# Patient Record
Sex: Male | Born: 2000 | Race: White | Hispanic: No | Marital: Single | Smoking: Never smoker
Health system: Southern US, Community
[De-identification: ages and names within clinical notes are randomized; demographics above are authoritative.]

---
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
## 2018-03-15 ENCOUNTER — Encounter

## 2018-03-15 ENCOUNTER — Encounter: Payer: Self-pay | Admitting: Urology

## 2018-03-15 ENCOUNTER — Ambulatory Visit (INDEPENDENT_AMBULATORY_CARE_PROVIDER_SITE_OTHER): Payer: BLUE CROSS/BLUE SHIELD | Admitting: Urology

## 2018-03-15 VITALS — BP 147/88 | HR 61 | Ht 68.0 in | Wt 175.1 lb

## 2018-03-15 DIAGNOSIS — N5082 Scrotal pain: Secondary | ICD-10-CM

## 2018-03-15 DIAGNOSIS — R93429 Abnormal radiologic findings on diagnostic imaging of unspecified kidney: Secondary | ICD-10-CM

## 2018-03-15 MED ORDER — AMITRIPTYLINE HCL 25 MG PO TABS: 25.0000 mg | ORAL_TABLET | Freq: Every day | ORAL | 0 refills | Status: DC

## 2018-03-15 NOTE — Progress Notes (Signed)
03/15/2018 9:28 AM   Darol DestineMason W Straub 02/17/2001 [id number]  Referring provider: No referring provider defined for this encounter.  Chief Complaint  Patient presents with  . Testicle Pain    HPI: [age] year old male presents with a 4 month history of left scrotal content pain.  He was initially seen at a [facility] facility on 12/15/2017.  He had intercourse in [place] and did not remember if it was protected.  He had negative testing for STI and was treated empirically with Rocephin and doxycycline.  His symptoms resolved for 5 days then returned.  At that visit he was felt to have right epididymitis and was treated with doxycycline.  He was subsequently referred to [facility] and has been seeing a NP there.  He was initially seen on 01/20/2018 and was complaining of bilateral scrotal pain, left > right.  Urinalysis and urine culture was negative.  PVR was 0 mL.  He was started on tamsulosin empirically.  He had a follow-up appointment 11/5 and had continued with intermittent pain.  A scrotal ultrasound was performed on 11/5 which showed normal-appearing testes bilaterally.  He was found to have a left varicocele.  A renal ultrasound was subsequently performed on 11/27 which showed mild left pelviectasis versus an extrarenal pelvis.  He is scheduled for a CT on 12/23 and has a follow-up with [facility].  He presents today with his father who is trying to expedite his evaluation as he is scheduled to enlist in the [employer] after the first of the year.  He presently complains of only left scrotal pain.  He states his pain is constant and described as dull and aching.  Severity is rated 6/10.  He denies frequency, urgency, dysuria or gross hematuria.  Denies previous history of scrotal pain or prior urologic evaluation.   PMH: No past medical history on file.  Surgical History: No past surgical history on file.  Home Medications:  Allergies as of 03/15/2018   No Known  Allergies     Medication List        Accurate as of 03/15/18  9:28 AM. Always use your most recent med list.          EPINEPHrine 0.3 mg/0.3 mL Soaj injection Commonly known as:  EPI-PEN Inject into the muscle.       Allergies: No Known Allergies  Family History: No family history on file.  Social History:  reports that he has never smoked. He has never used smokeless tobacco. He reports that he drinks alcohol. He reports that he has current or past drug history.  ROS: UROLOGY Frequent Urination?: No Hard to postpone urination?: No Burning/pain with urination?: No Get up at night to urinate?: No Leakage of urine?: No Urine stream starts and stops?: No Trouble starting stream?: No Do you have to strain to urinate?: No Blood in urine?: No Urinary tract infection?: No Sexually transmitted disease?: No Injury to kidneys or bladder?: No Painful intercourse?: No Weak stream?: No Erection problems?: No Penile pain?: No  Gastrointestinal Nausea?: No Vomiting?: No Indigestion/heartburn?: No Diarrhea?: No Constipation?: No  Constitutional Fever: No Night sweats?: No Weight loss?: No Fatigue?: No  Skin Skin rash/lesions?: No Itching?: No  Eyes Blurred vision?: No Double vision?: No  Ears/Nose/Throat Sore throat?: No Sinus problems?: No  Hematologic/Lymphatic Swollen glands?: No Easy bruising?: No  Cardiovascular Leg swelling?: No Chest pain?: No  Respiratory Cough?: No Shortness of breath?: No  Endocrine Excessive thirst?: No  Musculoskeletal Back pain?: No Joint pain?: No  Neurological Headaches?: No Dizziness?: No  Psychologic Depression?: No Anxiety?: No  Physical Exam: BP (!) 147/88 (BP Location: Left Arm, Patient Position: Sitting, Cuff Size: Normal)   Pulse 61   Ht 5\' 8"  (1.727 m)   Wt 175 lb 1.6 oz (79.4 kg)   BMI 26.62 kg/m   Constitutional:  Alert and oriented, No acute distress. HEENT: [place] AT, moist mucus membranes.   Trachea midline, no masses. Cardiovascular: No clubbing, cyanosis, or edema. Respiratory: Normal respiratory effort, no increased work of breathing. GI: Abdomen is soft, nontender, nondistended, no abdominal masses GU: No CVA tenderness.  Phallus circumcised without lesions.  Meatus normal.  Testes descended bilaterally without masses or tenderness.  I do not appreciate a left varicocele.  Epididymes palpably normal bilaterally without enlargement, mass or tenderness. Lymph: No cervical or inguinal lymphadenopathy. Skin: No rashes, bruises or suspicious lesions. Neurologic: Grossly intact, no focal deficits, moving all 4 extremities. Psychiatric: Normal mood and affect.   Pertinent Imaging: Scrotal and renal ultrasound images were not available for review.  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  I do not appreciate a varicocele on exam and a subclinical ultrasound.  His pain is not typical for varicocele related pain and a subclinical varicocele should not be an etiology of this pain.  His renal ultrasound showed mild left pelviectasis.  Agree with a CT of the abdomen pelvis to evaluate for referred pain such as a ureteral calculus.  Trial amitriptyline 25 mg nightly and follow-up in 2 to 3 weeks.  If CT negative and persistent pain on follow-up would recommend a diagnostic cord block.   Riki Altes, MD  [facility] [address], [address] [place], [place] [address] [phone]

## 2018-03-23 ENCOUNTER — Ambulatory Visit
Admission: RE | Admit: 2018-03-23 | Discharge: 2018-03-23 | Disposition: A | Discharge status: Home / Self Care | Payer: BLUE CROSS/BLUE SHIELD | Source: Ambulatory Visit | Attending: Urology | Admitting: Urology

## 2018-03-23 DIAGNOSIS — R93429 Abnormal radiologic findings on diagnostic imaging of unspecified kidney: Secondary | ICD-10-CM | POA: Diagnosis present

## 2018-03-27 ENCOUNTER — Encounter: Payer: Self-pay | Admitting: Urology

## 2018-03-27 ENCOUNTER — Ambulatory Visit (INDEPENDENT_AMBULATORY_CARE_PROVIDER_SITE_OTHER): Payer: BLUE CROSS/BLUE SHIELD | Admitting: Urology

## 2018-03-27 DIAGNOSIS — N1330 Unspecified hydronephrosis: Secondary | ICD-10-CM

## 2018-03-27 NOTE — Progress Notes (Signed)
03/27/2018 12:20 PM   Manuel Mitchell 10/24/2000 [id number]  Referring provider: No referring provider defined for this encounter.  Chief Complaint  Patient presents with  . Follow-up  . CT results    HPI: 17 year old male initially seen on 03/15/2018 for chronic left scrotal content pain.  Refer to my prior note of that date for details.  He has seen no significant improvement in his pain with amitriptyline.  Pain is stable and has not worsened.  Noncontrast CT abdomen pelvis performed 03/23/2018 shows proximal left hydroureter and mild hydronephrosis.  No calculus is identified.   PMH: History reviewed. No pertinent past medical history.  Surgical History: History reviewed. No pertinent surgical history.  Home Medications:  Allergies as of 03/27/2018   No Known Allergies     Medication List       Accurate as of March 27, 2018 12:20 PM. Always use your most recent med list.        amitriptyline 25 MG tablet Commonly known as:  ELAVIL Take 1 tablet (25 mg total) by mouth at bedtime.   EPINEPHrine 0.3 mg/0.3 mL Soaj injection Commonly known as:  EPI-PEN Inject into the muscle.       Allergies: No Known Allergies  Family History: History reviewed. No pertinent family history.  Social History:  reports that he has never smoked. He has never used smokeless tobacco. He reports current alcohol use. He reports previous drug use.  ROS: UROLOGY Frequent Urination?: No Hard to postpone urination?: No Burning/pain with urination?: No Get up at night to urinate?: No Leakage of urine?: No Urine stream starts and stops?: No Trouble starting stream?: No Do you have to strain to urinate?: No Blood in urine?: No Urinary tract infection?: No Sexually transmitted disease?: No Injury to kidneys or bladder?: No Painful intercourse?: No Weak stream?: No Erection problems?: No Penile pain?: No  Gastrointestinal Nausea?: No Vomiting?:  No Indigestion/heartburn?: No Diarrhea?: No Constipation?: No  Constitutional Fever: No Night sweats?: No Weight loss?: No Fatigue?: No  Skin Skin rash/lesions?: No Itching?: No  Eyes Blurred vision?: No Double vision?: No  Ears/Nose/Throat Sore throat?: No Sinus problems?: No  Hematologic/Lymphatic Swollen glands?: No Easy bruising?: No  Cardiovascular Leg swelling?: No Chest pain?: No  Respiratory Cough?: No Shortness of breath?: No  Endocrine Excessive thirst?: No  Musculoskeletal Back pain?: No Joint pain?: No  Neurological Headaches?: No Dizziness?: No  Psychologic Depression?: No Anxiety?: No  Physical Exam: BP 137/88 (BP Location: Left Arm, Patient Position: Sitting, Cuff Size: Normal)   Pulse 78   Ht 5\' 8"  (1.727 m)   Wt 176 lb 9.6 oz (80.1 kg)   BMI 26.85 kg/m   Constitutional:  Alert and oriented, No acute distress. HEENT: [place] AT, moist mucus membranes.  Trachea midline, no masses. Cardiovascular: No clubbing, cyanosis, or edema. Respiratory: Normal respiratory effort, no increased work of breathing. Skin: No rashes, bruises or suspicious lesions. Neurologic: Grossly intact, no focal deficits, moving all 4 extremities. Psychiatric: Normal mood and affect.   Pertinent Imaging: Images were personally reviewed  Results for orders placed during the hospital encounter of 03/23/18  CT RENAL STONE STUDY   Narrative CLINICAL DATA:  Follow-up LEFT-sided testicular pain.  EXAM: CT ABDOMEN AND PELVIS WITHOUT CONTRAST  TECHNIQUE: Multidetector CT imaging of the abdomen and pelvis was performed following the standard protocol without IV contrast.  COMPARISON:  None.  FINDINGS: Lower chest: Lung bases are clear.  Hepatobiliary: No focal hepatic lesion. No biliary duct dilatation.  Gallbladder is normal. Common bile duct is normal.  Pancreas: Pancreas is normal. No ductal dilatation. No pancreatic inflammation.  Spleen: Normal  spleen  Adrenals/urinary tract: Adrenal glands normal. No nephrolithiasis. Proximal LEFT hydroureter mild LEFT pelvicaliectasis. The LEFT ureter transits to normal caliber in the mid ureter. The ureter is difficult to follow in this young patient. No ureterolithiasis. The distal ureter is normal caliber. No bladder calculi.  RIGHT kidney is normal.  Stomach/Bowel: Stomach, small bowel, appendix, and cecum are normal. The colon and rectosigmoid colon are normal.  Vascular/Lymphatic: Abdominal aorta is normal caliber. No periportal or retroperitoneal adenopathy. No pelvic adenopathy.  Reproductive: Testicles grossly normal. Scrotum grossly normal prostate normal.  Other: No free fluid.  Musculoskeletal: No aggressive osseous lesion.  IMPRESSION: 1. Mild proximal LEFT hydroureter and LEFT pelvicaliectasis. No obstructing lesion identified on this noncontrast exam. No nephrolithiasis. Distal LEFT ureter appears normal caliber. 2. Normal RIGHT kidney ureter 3. Scrotum and testicles grossly normal by CT.   Electronically Signed   By: Genevive BiStewart  Edmunds M.D.   On: 03/23/2018 15:28     Assessment & Plan:   17 year old male with chronic left scrotal content pain and CT showing proximal hydroureter and mild hydronephrosis.  A ureteral calculus is not identified.  Discussed with patient and his father the etiology of the CT findings is not clear and the possibility he could be having referred left hemiscrotal pain due to obstruction.  I recommended a Lasix renogram and if any evidence of obstruction cystoscopy with left retrograde pyelogram and ureteroscopy.  He will be notified with the nuclear imaging results and further recommendations.   Riki AltesScott C Zachery Niswander, MD  [facility] [address], [address] [place], [place] [address] [phone]

## 2018-03-31 ENCOUNTER — Encounter
Admission: RE | Admit: 2018-03-31 | Discharge: 2018-03-31 | Disposition: A | Discharge status: Home / Self Care | Payer: BLUE CROSS/BLUE SHIELD | Source: Ambulatory Visit | Attending: Urology | Admitting: Urology

## 2018-03-31 DIAGNOSIS — N1330 Unspecified hydronephrosis: Secondary | ICD-10-CM | POA: Insufficient documentation

## 2018-03-31 MED ORDER — TECHNETIUM TC 99M MERTIATIDE
5.5560 | Freq: Once | INTRAVENOUS | Status: AC | PRN
  Administered 2018-03-31: 5.556 via INTRAVENOUS

## 2018-03-31 MED ORDER — FUROSEMIDE 10 MG/ML IJ SOLN
40.0000 mg | Freq: Once | INTRAMUSCULAR | Status: AC
  Administered 2018-03-31: 40 mg via INTRAVENOUS
  Filled 2018-03-31 (×2): qty 4

## 2018-04-07 ENCOUNTER — Telehealth: Payer: Self-pay | Admitting: Urology

## 2018-04-07 NOTE — Telephone Encounter (Signed)
Pt called office asking about nuclear imaging results and further recommendations aftter testing on 12/23.

## 2018-04-12 NOTE — Telephone Encounter (Signed)
Renal scan did not show any evidence of obstruction.  Unlikely the CT findings are responsible for scrotal pain.  If he would like to proceed with a cord block can schedule a follow-up appointment.

## 2018-04-12 NOTE — Telephone Encounter (Signed)
Left message for patient to call office to inform of results.

## 2018-04-12 NOTE — Telephone Encounter (Signed)
Pt has completed his imaging and is calling for results. Please advise pt @ [phone]. Thank you.

## 2018-04-13 NOTE — Telephone Encounter (Signed)
Informed patient of results and recommendations. 

## 2018-04-20 ENCOUNTER — Encounter: Payer: Self-pay | Admitting: Urology

## 2018-04-20 ENCOUNTER — Ambulatory Visit (INDEPENDENT_AMBULATORY_CARE_PROVIDER_SITE_OTHER): Payer: BLUE CROSS/BLUE SHIELD | Admitting: Urology

## 2018-04-20 VITALS — BP 148/93 | HR 65 | Ht 68.0 in | Wt 176.0 lb

## 2018-04-20 DIAGNOSIS — N5082 Scrotal pain: Secondary | ICD-10-CM | POA: Diagnosis not present

## 2018-04-20 NOTE — Progress Notes (Signed)
   04/20/2018 12:35 PM   Manuel Mitchell 2000-08-01 [id number]  Referring provider: No referring provider defined for this encounter.  Chief Complaint  Patient presents with  . Testicle Pain    HPI: 18 year old male with chronic left scrotal content pain.  No significant improvement on NSAIDs/amitriptyline.  Renal ultrasound showed fullness of the left collecting system and his CT showed proximal hydroureter and mild left hydronephrosis.  A Lasix renogram showed no evidence of obstruction.  Scrotal sonogram was unremarkable.  He presents today for diagnostic cord block.  PMH: No past medical history on file.  Surgical History: No past surgical history on file.  Home Medications:  Allergies as of 04/20/2018   No Known Allergies     Medication List       Accurate as of April 20, 2018 12:35 PM. Always use your most recent med list.        amitriptyline 25 MG tablet Commonly known as:  ELAVIL Take 1 tablet (25 mg total) by mouth at bedtime.   EPINEPHrine 0.3 mg/0.3 mL Soaj injection Commonly known as:  EPI-PEN Inject into the muscle.       Allergies: No Known Allergies  Family History: No family history on file.  Social History:  reports that he has never smoked. He has never used smokeless tobacco. He reports current alcohol use. He reports previous drug use.  ROS: UROLOGY Frequent Urination?: No Hard to postpone urination?: No Burning/pain with urination?: No Get up at night to urinate?: No Leakage of urine?: No Urine stream starts and stops?: No Trouble starting stream?: No Do you have to strain to urinate?: No Blood in urine?: No Urinary tract infection?: No Sexually transmitted disease?: No Injury to kidneys or bladder?: No Painful intercourse?: No Weak stream?: No Erection problems?: No Penile pain?: No  Gastrointestinal Nausea?: No Vomiting?: No Indigestion/heartburn?: No Diarrhea?: No Constipation?: No  Constitutional Fever:  No Night sweats?: No Weight loss?: No Fatigue?: No  Skin Skin rash/lesions?: No Itching?: No  Eyes Blurred vision?: No Double vision?: No  Ears/Nose/Throat Sore throat?: No Sinus problems?: No  Hematologic/Lymphatic Swollen glands?: No Easy bruising?: No  Cardiovascular Leg swelling?: No Chest pain?: No  Respiratory Cough?: No Shortness of breath?: No  Endocrine Excessive thirst?: No  Musculoskeletal Back pain?: No Joint pain?: No  Neurological Headaches?: No Dizziness?: No  Psychologic Depression?: No Anxiety?: No  Physical Exam: BP (!) 148/93 (BP Location: Left Arm, Patient Position: Sitting, Cuff Size: Normal)   Pulse 65   Ht 5\' 8"  (1.727 m)   Wt 176 lb (79.8 kg)   BMI 26.76 kg/m   Constitutional:  Alert and oriented, No acute distress. GU: Penis without lesions, testes descended bilaterally without masses or tenderness.  A left cord block was performed after prepping and draping the patient.  A milliliters of 0.5% plain bupivacaine was infiltrated into the spermatic cord at the pubic tubercle.   Assessment & Plan:   He will call back regarding any pain relief and duration with the cord block.   Riki Altes, MD  [facility] [address] [place], [place] [address] [phone]

## 2018-04-24 ENCOUNTER — Telehealth: Payer: Self-pay | Admitting: Urology

## 2018-04-24 NOTE — Telephone Encounter (Signed)
Patient called stating that the cord block did not work and he would like to know what the next step would be Please advise thanks

## 2018-04-24 NOTE — Telephone Encounter (Signed)
Pt called office asking about cord block.

## 2018-04-25 NOTE — Telephone Encounter (Signed)
Recommend referral to either [facility] or [facility] depending on his preference.

## 2018-05-01 NOTE — Telephone Encounter (Signed)
Pt asking for a referral to [facility], please advise. Thanks.

## 2020-08-23 IMAGING — CT CT RENAL STONE PROTOCOL
1 of 2 series · 15 of 32 positions shown, 19 images · non-contrast
Comparison: None.

CLINICAL DATA: Follow-up LEFT-sided testicular pain.

EXAM:
CT ABDOMEN AND PELVIS WITHOUT CONTRAST
TECHNIQUE: Multidetector CT imaging of the abdomen and pelvis was performed
following the standard protocol without IV contrast.

[Series 2: axial st · axial · 0.71mm/px · z∈[-910,-445]mm · 15 of 103 slices shown, 19 images]
[im 5/103  soft-tissue]
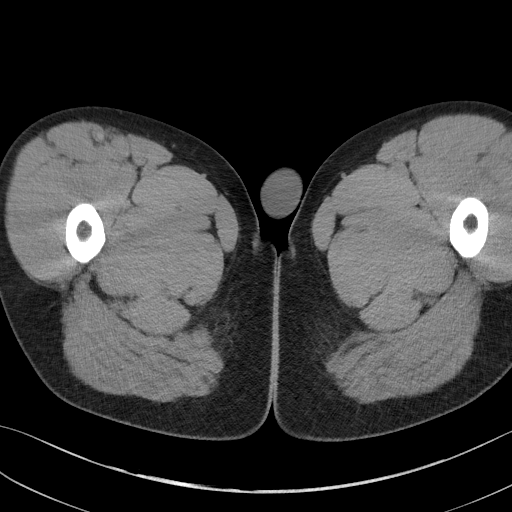
[im 5/103  bone]
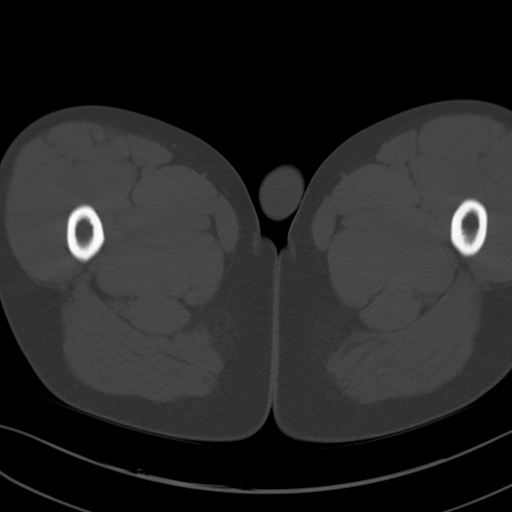
[im 13/103  soft-tissue]
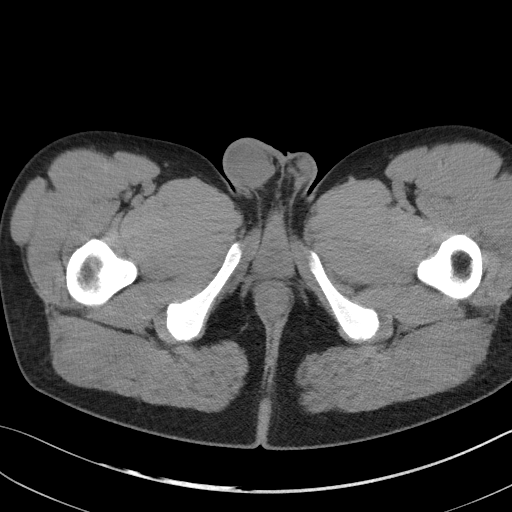
[im 22/103  soft-tissue]
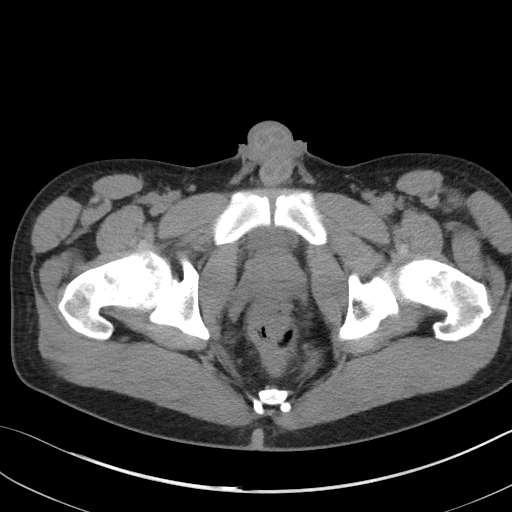
[im 30/103  soft-tissue]
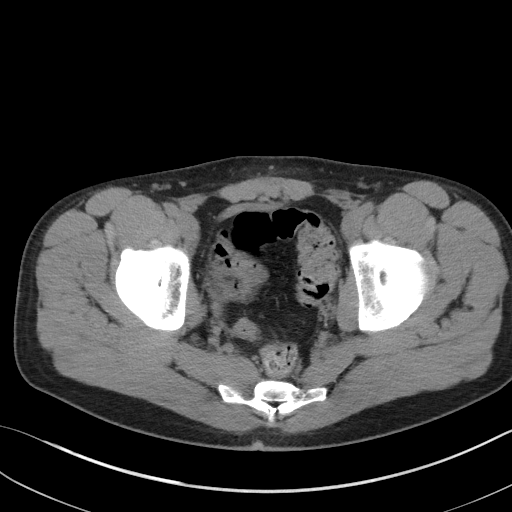
[im 35/103  soft-tissue]
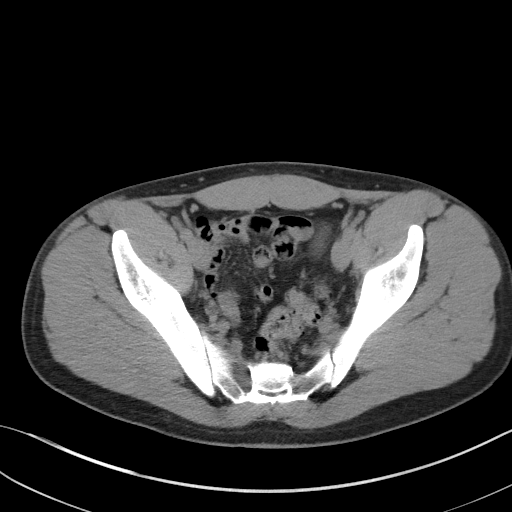
[im 43/103  soft-tissue]
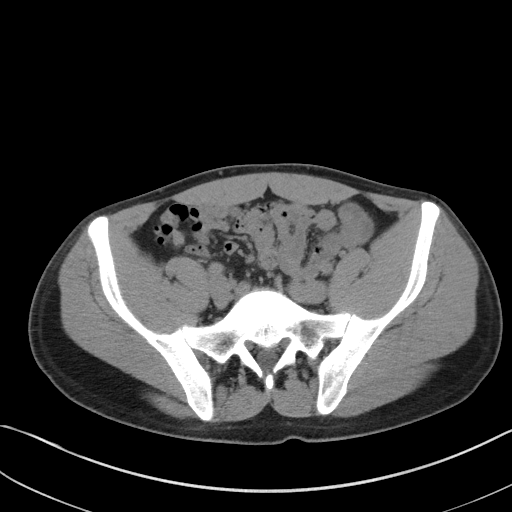
[im 52/103  soft-tissue]
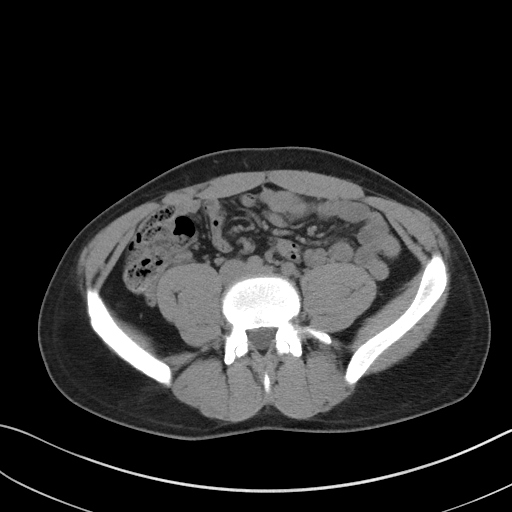
[im 60/103  soft-tissue]
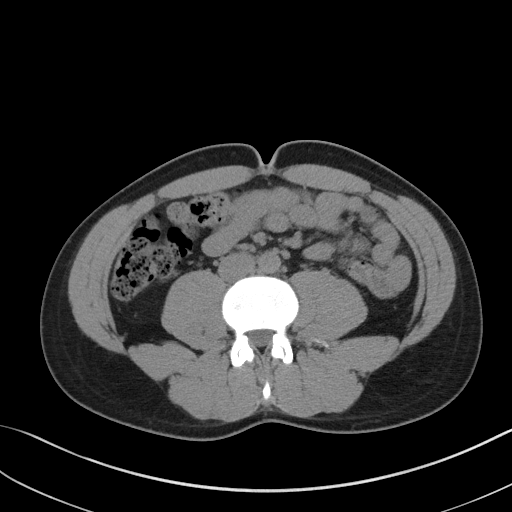
[im 69/103  soft-tissue]
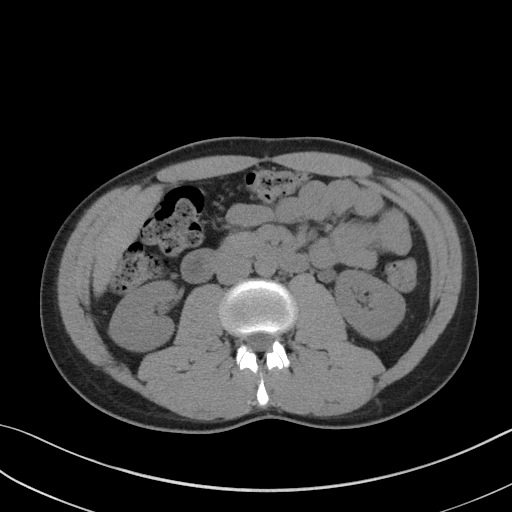
[im 69/103  bone]
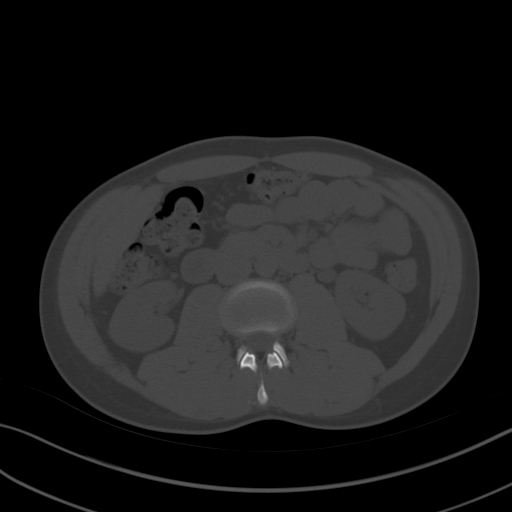
[im 73/103  soft-tissue]
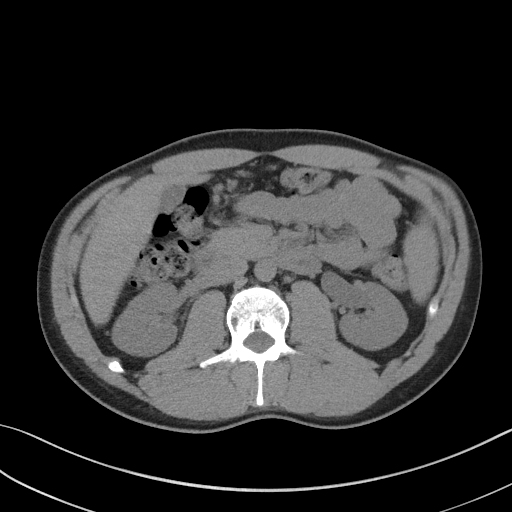
[im 81/103  soft-tissue]
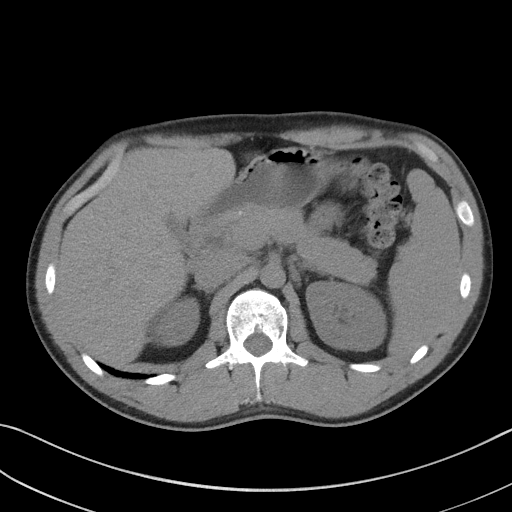
[im 86/103  lung]
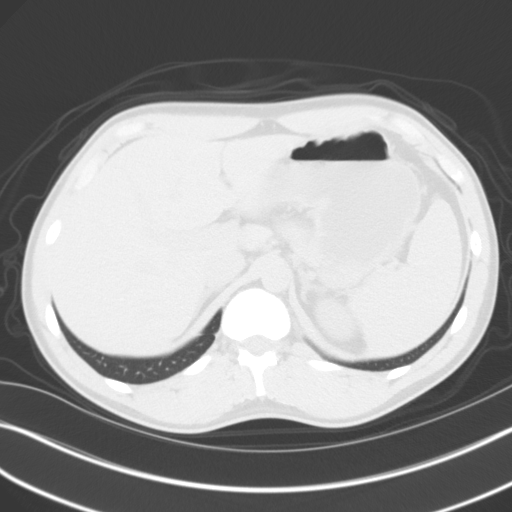
[im 90/103  soft-tissue]
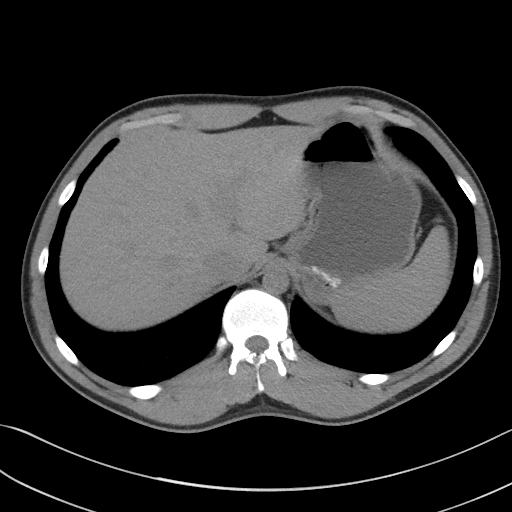
[im 90/103  lung]
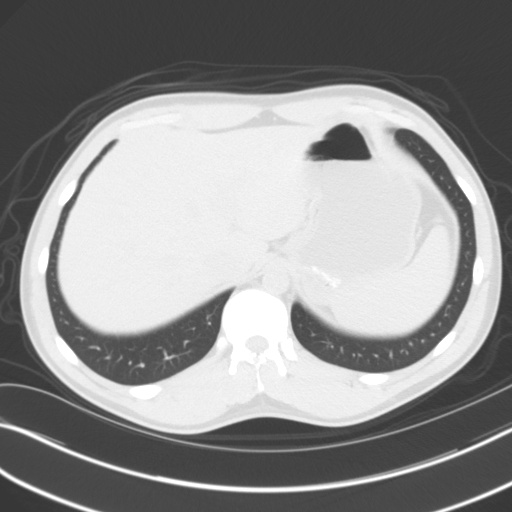
[im 94/103  lung]
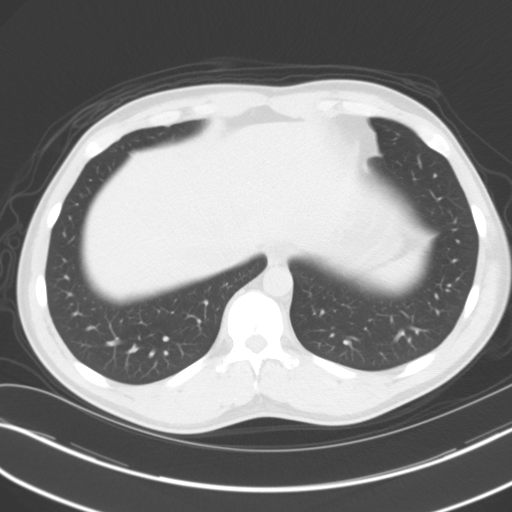
[im 98/103  soft-tissue]
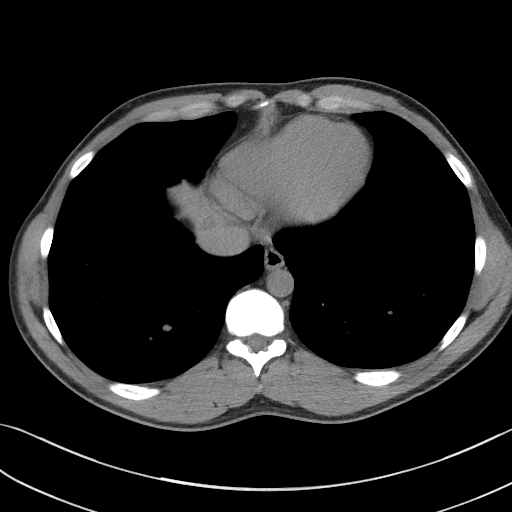
[im 98/103  lung]
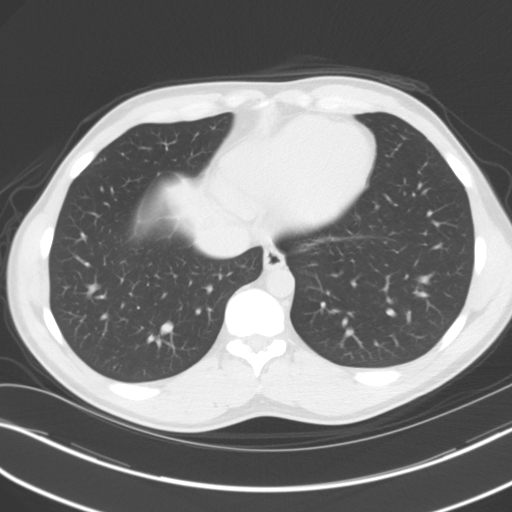

[15 of 32 positions shown; findings below may reference images not displayed]

FINDINGS: Lower chest: Lung bases are clear.

Hepatobiliary: No focal hepatic lesion. No biliary duct dilatation.
Gallbladder is normal. Common bile duct is normal.

Pancreas: Pancreas is normal. No ductal dilatation. No pancreatic
inflammation.

Spleen: Normal spleen

Adrenals/urinary tract: Adrenal glands normal. No nephrolithiasis.
Proximal LEFT hydroureter mild LEFT pelvicaliectasis. The LEFT
ureter transits to normal caliber in the mid ureter. The ureter is
difficult to follow in this young patient. No ureterolithiasis. The
distal ureter is normal caliber. No bladder calculi.

RIGHT kidney is normal.

Stomach/Bowel: Stomach, small bowel, appendix, and cecum are normal.
The colon and rectosigmoid colon are normal.

Vascular/Lymphatic: Abdominal aorta is normal caliber. No periportal
or retroperitoneal adenopathy. No pelvic adenopathy.

Reproductive: Testicles grossly normal. Scrotum grossly normal
prostate normal.

Other: No free fluid.

Musculoskeletal: No aggressive osseous lesion.
IMPRESSION: 1. Mild proximal LEFT hydroureter and LEFT pelvicaliectasis. No
obstructing lesion identified on this noncontrast exam. No
nephrolithiasis. Distal LEFT ureter appears normal caliber.
2. Normal RIGHT kidney ureter
3. Scrotum and testicles grossly normal by CT.

## 2024-04-10 ENCOUNTER — Observation Stay
Admission: EM | Admit: 2024-04-10 | Discharge: 2024-04-11 | Disposition: A | Discharge status: Home / Self Care | Attending: Family Medicine | Admitting: Family Medicine

## 2024-04-10 ENCOUNTER — Encounter: Payer: Self-pay | Admitting: Emergency Medicine

## 2024-04-10 ENCOUNTER — Ambulatory Visit
Admission: EM | Admit: 2024-04-10 | Discharge: 2024-04-10 | Attending: Emergency Medicine | Admitting: Emergency Medicine

## 2024-04-10 ENCOUNTER — Emergency Department

## 2024-04-10 ENCOUNTER — Other Ambulatory Visit: Payer: Self-pay

## 2024-04-10 DIAGNOSIS — J039 Acute tonsillitis, unspecified: Principal | ICD-10-CM | POA: Insufficient documentation

## 2024-04-10 DIAGNOSIS — J36 Peritonsillar abscess: Secondary | ICD-10-CM

## 2024-04-10 DIAGNOSIS — J029 Acute pharyngitis, unspecified: Secondary | ICD-10-CM | POA: Diagnosis present

## 2024-04-10 LAB — COMPREHENSIVE METABOLIC PANEL WITH GFR
ALT: 16 U/L (ref 0–44)
AST: 37 U/L (ref 15–41)
Albumin: 4 g/dL (ref 3.5–5.0)
Alkaline Phosphatase: 80 U/L (ref 38–126)
Anion gap: 14 (ref 5–15)
BUN: 7 mg/dL (ref 6–20)
CO2: 22 mmol/L (ref 22–32)
Calcium: 9.5 mg/dL (ref 8.9–10.3)
Chloride: 97 mmol/L — ABNORMAL LOW (ref 98–111)
Creatinine, Ser: 0.83 mg/dL (ref 0.61–1.24)
GFR, Estimated: >60 mL/min
Glucose, Bld: 101 mg/dL — ABNORMAL HIGH (ref 70–99)
Potassium: 4.8 mmol/L (ref 3.5–5.1)
Sodium: 133 mmol/L — ABNORMAL LOW (ref 135–145)
Total Bilirubin: 1 mg/dL (ref 0.0–1.2)
Total Protein: 7.9 g/dL (ref 6.5–8.1)

## 2024-04-10 LAB — CBC WITH DIFFERENTIAL/PLATELET
Abs Immature Granulocytes: 0.03 K/uL (ref 0.00–0.07)
Basophils Absolute: 0.1 K/uL (ref 0.0–0.1)
Basophils Relative: 0 %
Eosinophils Absolute: 0 K/uL (ref 0.0–0.5)
Eosinophils Relative: 0 %
HCT: 46.3 % (ref 39.0–52.0)
Hemoglobin: 16.3 g/dL (ref 13.0–17.0)
Immature Granulocytes: 0 %
Lymphocytes Relative: 14 %
Lymphs Abs: 1.7 K/uL (ref 0.7–4.0)
MCH: 30.8 pg (ref 26.0–34.0)
MCHC: 35.2 g/dL (ref 30.0–36.0)
MCV: 87.5 fL (ref 80.0–100.0)
Monocytes Absolute: 1.5 K/uL — ABNORMAL HIGH (ref 0.1–1.0)
Monocytes Relative: 12 %
Neutro Abs: 8.6 K/uL — ABNORMAL HIGH (ref 1.7–7.7)
Neutrophils Relative %: 74 %
Platelets: 250 K/uL (ref 150–400)
RBC: 5.29 MIL/uL (ref 4.22–5.81)
RDW: 11.2 % — ABNORMAL LOW (ref 11.5–15.5)
WBC: 11.9 K/uL — ABNORMAL HIGH (ref 4.0–10.5)
nRBC: 0 % (ref 0.0–0.2)

## 2024-04-10 LAB — RESP PANEL BY RT-PCR (RSV, FLU A&B, COVID)  RVPGX2
Influenza A by PCR: NEGATIVE
Influenza B by PCR: NEGATIVE
Resp Syncytial Virus by PCR: NEGATIVE
SARS Coronavirus 2 by RT PCR: NEGATIVE

## 2024-04-10 LAB — MONONUCLEOSIS SCREEN: Mono Screen: NEGATIVE

## 2024-04-10 LAB — POCT RAPID STREP A (OFFICE): Rapid Strep A Screen: NEGATIVE

## 2024-04-10 MED ORDER — ALUM & MAG HYDROXIDE-SIMETH 200-200-20 MG/5ML PO SUSP
5.0000 mL | Freq: Once | ORAL | Status: AC
  Administered 2024-04-10: 5 mL via ORAL

## 2024-04-10 MED ORDER — ACETAMINOPHEN 325 MG PO TABS: 650.0000 mg | ORAL_TABLET | Freq: Four times a day (QID) | ORAL | Status: DC | PRN

## 2024-04-10 MED ORDER — SODIUM CHLORIDE 0.9 % IV SOLN
3.0000 g | Freq: Once | INTRAVENOUS | Status: AC
  Administered 2024-04-10: 3 g via INTRAVENOUS
  Filled 2024-04-10: qty 8

## 2024-04-10 MED ORDER — DIPHENHYDRAMINE HCL 12.5 MG/5ML PO ELIX
12.5000 mg | ORAL_SOLUTION | Freq: Once | ORAL | Status: AC
  Administered 2024-04-10: 12.5 mg via ORAL

## 2024-04-10 MED ORDER — PANTOPRAZOLE SODIUM 40 MG IV SOLR
40.0000 mg | Freq: Two times a day (BID) | INTRAVENOUS | Status: DC
  Administered 2024-04-10 – 2024-04-11 (×3): 40 mg via INTRAVENOUS
  Filled 2024-04-10 (×3): qty 10

## 2024-04-10 MED ORDER — IBUPROFEN 800 MG PO TABS
800.0000 mg | ORAL_TABLET | Freq: Once | ORAL | Status: AC
  Administered 2024-04-10: 800 mg via ORAL

## 2024-04-10 MED ORDER — OXYCODONE HCL 5 MG PO TABS
5.0000 mg | ORAL_TABLET | ORAL | Status: DC | PRN
  Administered 2024-04-10 – 2024-04-11 (×2): 5 mg via ORAL
  Filled 2024-04-10 (×2): qty 1

## 2024-04-10 MED ORDER — SODIUM CHLORIDE 0.9 % IV SOLN
3.0000 g | Freq: Four times a day (QID) | INTRAVENOUS | Status: DC
  Administered 2024-04-10 – 2024-04-11 (×4): 3 g via INTRAVENOUS
  Filled 2024-04-10 (×5): qty 8

## 2024-04-10 MED ORDER — ENOXAPARIN SODIUM 40 MG/0.4ML IJ SOSY
40.0000 mg | PREFILLED_SYRINGE | Freq: Every day | INTRAMUSCULAR | Status: DC
  Administered 2024-04-10: 40 mg via SUBCUTANEOUS
  Filled 2024-04-10 (×2): qty 0.4

## 2024-04-10 MED ORDER — SODIUM CHLORIDE 0.9 % IV SOLN: INTRAVENOUS | Status: AC

## 2024-04-10 MED ORDER — ACETAMINOPHEN 650 MG RE SUPP: 650.0000 mg | Freq: Four times a day (QID) | RECTAL | Status: DC | PRN

## 2024-04-10 MED ORDER — KETOROLAC TROMETHAMINE 15 MG/ML IJ SOLN: 15.0000 mg | Freq: Four times a day (QID) | INTRAMUSCULAR | Status: DC | PRN

## 2024-04-10 MED ORDER — IOHEXOL 300 MG/ML  SOLN
75.0000 mL | Freq: Once | INTRAMUSCULAR | Status: AC | PRN
  Administered 2024-04-10: 75 mL via INTRAVENOUS

## 2024-04-10 MED ORDER — DEXAMETHASONE SOD PHOSPHATE PF 10 MG/ML IJ SOLN
10.0000 mg | Freq: Once | INTRAMUSCULAR | Status: AC
  Administered 2024-04-10: 10 mg via INTRAVENOUS

## 2024-04-10 MED ORDER — DEXAMETHASONE SOD PHOSPHATE PF 10 MG/ML IJ SOLN
8.0000 mg | Freq: Two times a day (BID) | INTRAMUSCULAR | Status: DC
  Administered 2024-04-10 – 2024-04-11 (×3): 8 mg via INTRAVENOUS
  Filled 2024-04-10 (×3): qty 0.8

## 2024-04-10 NOTE — ED Provider Notes (Signed)
 "  [facility] Provider Note    Event Date/Time   First MD Initiated Contact with Patient 04/10/24 [phone]     (approximate)   History   Sore Throat   HPI  Manuel Mitchell is a 24 y.o. male  with no significant past medical history and as listed in EMR presents to the emergency department for tree and evaluation of sore throat that started 5 days ago but has Mitchell over the past 2 days.  He was sent here from urgent care for high concern of peritonsillar abscess.  Strep test there was negative.  Patient has had intermittent low-grade fever.   Physical Exam    Vitals:   04/10/24 0916  BP: (!) 157/110  Pulse: 87  Resp: 16  Temp: 99 F (37.2 C)  SpO2: 100%    General: Awake, no distress.  CV:  Good peripheral perfusion.  Resp:  Normal effort.  Abd:  No distention.  Other:  Uvular deviation to the left with oropharynx edema and erythema. Voice is muffled. Tolerating secretions. Trismus noted--can open mouth to 3 fingerbreadths.    ED Results / Procedures / Treatments   Labs (all labs ordered are listed, but only abnormal results are displayed)  Labs Reviewed  CBC WITH DIFFERENTIAL/PLATELET - Abnormal; Notable for the following components:      Result Value   WBC 11.9 (*)    RDW 11.2 (*)    Neutro Abs 8.6 (*)    Monocytes Absolute 1.5 (*)    All other components within normal limits  COMPREHENSIVE METABOLIC PANEL WITH GFR - Abnormal; Notable for the following components:   Sodium 133 (*)    Chloride 97 (*)    Glucose, Bld 101 (*)    All other components within normal limits  RESP PANEL BY RT-PCR (RSV, FLU A&B, COVID)  RVPGX2  MONONUCLEOSIS SCREEN  HIV ANTIBODY (ROUTINE TESTING W REFLEX)     EKG  Not indicated.   RADIOLOGY  Image and radiology report reviewed and interpreted by me. Radiology report consistent with the same.  CT soft tissue neck shows acute bilateral tonsillitis.  Early separation of the right tonsil and secondary  inflammation of the right parapharyngeal and submandibular spaces.  No organized or drainable abscess noted.  Reactive bilateral cervical lymphadenopathy.  PROCEDURES:  Critical Care performed: No  Procedures   IMPRESSION / MDM / ASSESSMENT AND PLAN / ED COURSE   I have reviewed the triage note and vital signs. Vital signs: hypertension   Differential diagnosis includes, but is not limited to, peritonsillar abscess, retropharyngeal abscess, epiglottitis  Patient's presentation is most consistent with acute presentation with potential threat to life or bodily function.  24 year old male presenting to the emergency department for treatment and evaluation of sore throat.  See HPI for further details.  Exam is concerning for peritonsillar abscess versus retropharyngeal abscess.  Uvula is deviated to the left.  Trismus noted--he can open to 3 fingerbreadths.  IV Decadron  and Unasyn  given.  CT soft tissue neck with contrast ordered.  Clinical Course as of 04/10/24 1427  Tue Apr 10, 2024  1118 Consulted with Dr. Milissa. He will come see patient after CT is complete.  [CT]  1256 CT shows bilateral tonsillitis with parapharyngeal inflammation.  No loculated fluid.  Dr. Milissa has been in to see patient. Plan is to admit for antibiotics and steroids. Hospitalist consult requested. [CT]    Clinical Course User Index [CT] Leotha Westermeyer, Kirk NOVAK, FNP  FINAL CLINICAL IMPRESSION(S) / ED DIAGNOSES   Final diagnoses:  Tonsillitis     Rx / DC Orders   ED Discharge Orders     None        Note:  This document was prepared using Dragon voice recognition software and may include unintentional dictation errors.   Herlinda Kirk NOVAK, FNP 04/10/24 1428    Floy Roberts, MD 04/10/24 1433  "

## 2024-04-10 NOTE — ED Triage Notes (Signed)
 Pt sent from UC for possible peritonsillar abscess. Endorses fever and sore throat since Thursday. Negative strep there.

## 2024-04-10 NOTE — ED Notes (Signed)
 Patient is being discharged from the Urgent Care and sent to the Emergency Department via POV . Per Dr.Mortenson, patient is in need of higher level of care due to Peritonsillar abscess. Patient is aware and verbalizes understanding of plan of care.  Vitals:   04/10/24 0824  BP: (!) 140/98  Pulse: 91  Resp: 17  Temp: (!) 100.8 F (38.2 C)  SpO2: 98%

## 2024-04-10 NOTE — ED Triage Notes (Signed)
 Sx x 5 days  Sore throat Fever- no meds today  No exposures

## 2024-04-10 NOTE — Discharge Instructions (Addendum)
 Your rapid strep is negative.  We have given you a 10 mg of ibuprofen  and 5 mL of Benadryl  mixed with 5 mL of Maalox.  I am concerned that you have a right-sided peritonsillar abscess.  I am sending you to the emergency department for further diagnostics,treatment and specialty consultation if necessary.  Go there immediately.

## 2024-04-10 NOTE — Plan of Care (Signed)
   Problem: Education: Goal: Knowledge of General Education information will improve Description Including pain rating scale, medication(s)/side effects and non-pharmacologic comfort measures Outcome: Progressing   Problem: Clinical Measurements: Goal: Will remain free from infection Outcome: Progressing   Problem: Clinical Measurements: Goal: Cardiovascular complication will be avoided Outcome: Progressing

## 2024-04-10 NOTE — Consult Note (Signed)
 Manuel Mitchell, Manuel Mitchell [id number] 2001-02-21 Manuel Gouty, MD  Reason for Consult: tonsillitis, possible peri-tonsillar abscess  HPI: 24 year old male presented to UC earlier today with 5 day history of progressively worsening sore throat.  Concern for PTA at Urgent Care and treated with 800mg  Motrin  and Benadryl /Maalox and then sent to ER.  Concern for possible peri-tonsillar abscess by exam and asked to evaluate.  Patient denies breathing issues.  Reports pain with opening throat.  Difficulty tolerating diet but tolerates own secretions.  No prior history of similar episodes but did have some strep as a child.  No normal issues with tonsils.  No prior history of mono.  Strep negative at [facility].  Allergies: Allergies[1]  ROS: Review of systems normal other than 12 systems except per HPI.  PMH: History reviewed. No pertinent past medical history.  FH: History reviewed. No pertinent family history.  SH:  Social History   Socioeconomic History   Marital status: Single    Spouse name: Not on file   Number of children: Not on file   Years of education: Not on file   Highest education level: Not on file  Occupational History   Not on file  Tobacco Use   Smoking status: Never   Smokeless tobacco: Never  Vaping Use   Vaping status: Every Day  Substance and Sexual Activity   Alcohol use: Yes    Comment: social   Drug use: Not Currently   Sexual activity: Yes    Birth control/protection: None  Other Topics Concern   Not on file  Social History Narrative   Not on file   Social Drivers of Health   Tobacco Use: Low Risk (04/10/2024)   Patient History    Smoking Tobacco Use: Never    Smokeless Tobacco Use: Never    Passive Exposure: Not on file  Financial Resource Strain: Not on file  Food Insecurity: No Food Insecurity (04/10/2024)   Epic    Worried About Programme Researcher, Broadcasting/film/video in the Last Year: Never true    Ran Out of Food in the Last Year: Never true  Transportation Needs: No  Transportation Needs (04/10/2024)   Epic    Lack of Transportation (Medical): No    Lack of Transportation (Non-Medical): No  Physical Activity: Not on file  Stress: Not on file  Social Connections: Not on file  Intimate Partner Violence: Not At Risk (04/10/2024)   Epic    Fear of Current or Ex-Partner: No    Emotionally Abused: No    Physically Abused: No    Sexually Abused: No  Depression (PHQ2-9): Not on file  Alcohol Screen: Not on file  Housing: Low Risk (04/10/2024)   Epic    Unable to Pay for Housing in the Last Year: No    Number of Times Moved in the Last Year: 0    Homeless in the Last Year: No  Utilities: Not At Risk (04/10/2024)   Epic    Threatened with loss of utilities: No  Health Literacy: Not on file    PSH: History reviewed. No pertinent surgical history.  Physical  Exam:  GEN-  NAD sitting upright in bed NEURO-  CN 2-12 grossly intact and symmetric. EARS-  External ears clear NOSE- clear anteriorly OC/OP-  erythematous tonsils and edematous soft palate with widening of uvula and mild deviation of uvula to left.  Mild asymmetry of tonsils with right being larger and more anterior compared to left.  Trismus to 3cm NECK-  reactive lymph  nodes and tenderness, mostly on left. CARD- Regular RESP- unlabored EXT-  normal turgor  CT:  IMPRESSION: 1. Acute bilateral tonsillitis. Early suppuration of the right tonsil, and secondary inflammation in the right parapharyngeal and submandibular spaces. But no organized or drainable abscess. 2. Reactive bilateral cervical lymphadenopathy.   A/P: Tonsillitis with early phlegmonous changes to right tonsil but no drainable abscess collection.  Plan:  Reviewed CT with patient and family.  Recommend mono testing.  Recommend treatment with Decadron  IV 8mg  q 12 and agree with Unasyn .  Anticipate improvement and switching to PO tomorrow.  Will follow while inpatient and then see next week as outpatient.   Manuel  Sheliah Mitchell 04/10/2024 6:21 PM       [1] No Known Allergies

## 2024-04-10 NOTE — H&P (Signed)
 "  History and Physical    Manuel Mitchell FMW:[id number] DOB: 01-13-2001 DOA: 04/10/2024  DOS: the patient was seen and examined on 04/10/2024  PCP: Patient, No Pcp Per   Patient coming from: Home  I have personally briefly reviewed patient's old medical records in [facility]  Chief Complaint: Sore throat for 5 days  HPI: Manuel Mitchell is a pleasant 24 y.o. male with no significant medical history came in for 5 days of sore throat worsened over the last 2 days.  Patient complained that he had low-grade temperature but he did not measure at home.  He complains that he has pain to the point that he is not able to eat and drink.  He also complains of trismus.  He was seen at the urgent care and there was concern for possible peritonsillar abscess.  His strep throat was negative.  Patient was transferred over to the emergency room for evaluation.  ED Course: Upon arrival to the ED, patient is found to have mild leukocytosis at 11.9, CT neck with contrast showed bilateral tonsillitis more on the right side and enlarged lymph node.  EDP discussed with Dr. Milissa who advised to give him antibiotic and steroid and they will see the patient.  Hospitalist service was consulted for evaluation for admission.  Review of Systems:  ROS  All other systems negative except as noted in the HPI.  History reviewed. No pertinent past medical history.  History reviewed. No pertinent surgical history.   reports that he has never smoked. He has never used smokeless tobacco. He reports current alcohol use. He reports that he does not currently use drugs.  Allergies[1]  History reviewed. No pertinent family history.  Prior to Admission medications  Medication Sig Start Date End Date Taking? Authorizing Provider  amitriptyline  (ELAVIL ) 25 MG tablet Take 1 tablet (25 mg total) by mouth at bedtime. 03/15/18   Stoioff, Glendia BROCKS, MD  EPINEPHrine 0.3 mg/0.3 mL IJ SOAJ injection Inject into the muscle. 05/24/16    [provider]    Physical Exam: Vitals:   04/10/24 0916 04/10/24 0917  BP: (!) 157/110   Pulse: 87   Resp: 16   Temp: 99 F (37.2 C)   SpO2: 100%   Weight:  81.6 kg  Height:  5' 10 (1.778 m)    Physical Exam   Constitutional: Alert, awake, calm, comfortable HEENT: Neck supple bilateral tonsils are swollen mostly on the right side Respiratory: Clear to auscultation B/L, no wheezing, no rales.  Cardiovascular: Regular rate and rhythm, no murmurs / rubs / gallops. No extremity edema. 2+ pedal pulses. No carotid bruits.  Abdomen: Soft, no tenderness, Bowel sounds positive.  Musculoskeletal: no clubbing / cyanosis. Good ROM, no contractures. Normal muscle tone.  Skin: no rashes, lesions, ulcers. Neurologic: CN 2-12 grossly intact. Sensation intact, No focal deficit identified Psychiatric: Alert and oriented x 3. Normal mood.    Labs on Admission: I have personally reviewed following labs and imaging studies  CBC: Recent Labs  Lab 04/10/24 1048  WBC 11.9*  NEUTROABS 8.6*  HGB 16.3  HCT 46.3  MCV 87.5  PLT 250   Basic Metabolic Panel: Recent Labs  Lab 04/10/24 1048  NA 133*  K 4.8  CL 97*  CO2 22  GLUCOSE 101*  BUN 7  CREATININE 0.83  CALCIUM 9.5   GFR: Estimated Creatinine Clearance: 140.5 mL/min (by C-G formula based on SCr of 0.83 mg/dL). Liver Function Tests: Recent Labs  Lab 04/10/24  1048  AST 37  ALT 16  ALKPHOS 80  BILITOT 1.0  PROT 7.9  ALBUMIN 4.0   No results for input(s): LIPASE, AMYLASE in the last 168 hours. No results for input(s): AMMONIA in the last 168 hours. Coagulation Profile: No results for input(s): INR, PROTIME in the last 168 hours. Cardiac Enzymes: No results for input(s): CKTOTAL, CKMB, CKMBINDEX, TROPONINI, TROPONINIHS in the last 168 hours. BNP (last 3 results) No results for input(s): BNP in the last 8760 hours. HbA1C: No results for input(s): HGBA1C in the last 72 hours. CBG: No  results for input(s): GLUCAP in the last 168 hours. Lipid Profile: No results for input(s): CHOL, HDL, LDLCALC, TRIG, CHOLHDL, LDLDIRECT in the last 72 hours. Thyroid Function Tests: No results for input(s): TSH, T4TOTAL, FREET4, T3FREE, THYROIDAB in the last 72 hours. Anemia Panel: No results for input(s): VITAMINB12, FOLATE, FERRITIN, TIBC, IRON, RETICCTPCT in the last 72 hours. Urine analysis:    Component Value Date/Time   COLORURINE YELLOW 11/24/2017 2009   APPEARANCEUR CLEAR 11/24/2017 2009   LABSPEC 1.020 11/24/2017 2009   PHURINE 5.5 11/24/2017 2009   GLUCOSEU NEGATIVE 11/24/2017 2009   HGBUR NEGATIVE 11/24/2017 2009   BILIRUBINUR NEGATIVE 11/24/2017 2009   KETONESUR NEGATIVE 11/24/2017 2009   PROTEINUR NEGATIVE 11/24/2017 2009   NITRITE NEGATIVE 11/24/2017 2009   LEUKOCYTESUR NEGATIVE 11/24/2017 2009    Radiological Exams on Admission: I have personally reviewed images CT Soft Tissue Neck W Contrast Result Date: 04/10/2024 EXAM: CT NECK WITH CONTRAST 04/10/2024 11:28:28 AM TECHNIQUE: CT of the neck was performed with the administration of 75 mL iohexol  (OMNIPAQUE ) 300 MG/ML solution. Multiplanar reformatted images are provided for review. Automated exposure control, iterative reconstruction, and/or weight based adjustment of the mA/kV was utilized to reduce the radiation dose to as low as reasonably achievable. COMPARISON: None available. CLINICAL HISTORY: 24 year old male with persistent fever and sore throat. Peritonsillar abscess. FINDINGS: AERODIGESTIVE TRACT: Larynx and epiglottis are within normal limits. The hypopharynx is mildly distended with gas. Lingual tonsil within normal limits. Bilateral palatine tonsils are heterogeneously enlarged, effacing the midline (series 2 image 41 and coronal image 52). There are indistinct areas of hypodensity within the palatine tonsils more apparent on the right (series 2 image 34 and coronal image 55),  but no organized or drainable tonsillar abscess. There is associated inflammation in the right parapharyngeal space (series 2 image 29). Left parapharyngeal space remains normal. There is mild if any retropharyngeal space edema. Adenoid soft tissues also appear more normal. Negative sublingual space. SALIVARY GLANDS: The parotid glands are unremarkable. The right submandibular gland appears to remain normal. There is secondary inflammation in the right submandibular space (series 2 image 52). Other salivary glands appear normal. THYROID: Unremarkable. LYMPH NODES: Solid enlarged, reactive appearing bilateral cervical lymphadenopathy. The largest nodes are at the bilateral levels 2 stations measuring up to 15 mm short axis. SOFT TISSUES: No mass or fluid collection. BONES: No acute dental finding. No acute osseous abnormality. OTHER: Visualized sinuses and mastoid air cells are well aerated. Minimal paranasal sinus mucosal thickening. No sinus fluid levels. Tympanic cavities and mastoids are clear. Negative visible brain parenchyma and orbits. Major vascular structures in the bilateral neck and at the skull base are enhancing and appear normal. Negative visible upper chest. Visualized lungs are clear. IMPRESSION: 1. Acute bilateral tonsillitis. Early suppuration of the right tonsil, and secondary inflammation in the right parapharyngeal and submandibular spaces. But no organized or drainable abscess. 2. Reactive bilateral cervical lymphadenopathy. Electronically signed  by: Helayne Hurst MD 04/10/2024 12:02 PM EST RP Workstation: [id number]    EKG: N/A    Assessment/Plan Principal Problem:   Tonsillitis    Assessment and Plan: 24 year old male with no significant past medical history came in for 5 days of sore throat and not able to eat and drink.  1.  Bilateral tonsillitis/pharyngitis - Patient has significant tonsillitis and pharyngitis to the point that the patient is not able to open the mouth for  eating. - Patient was given Unasyn  and Decadron  in the emergency room. - ENT provider Dr. Milissa advised to admit the patient and they will see the patient. - Continue IV antibiotics Unasyn , Decadron  twice daily, IV fluid, soft diet. - Pain medication with Tylenol , oxycodone  and Toradol  - I will also give him Protonix  due to use of steroid. - Hopefully he might be able to be discharged home if feels better tomorrow.    DVT prophylaxis: Lovenox  Code Status: Full Code Family Communication: Parents were at bedside Disposition Plan: Home Consults called: ENT by ED provider Admission status: Observation, Med-Surg   Nena Rebel, MD [facility] 04/10/2024, 2:08 PM        [1] No Known Allergies  "

## 2024-04-10 NOTE — ED Provider Notes (Signed)
 " HPI  SUBJECTIVE:  Patient reports sore throat starting 5 days ago. Sx worse with swallowing, talking.  Sx better nothing.  Has been taking Advil  400 mg Q4-6 hours and tried cough drops without relief. + Fever tmax 100.8 here no neck stiffness  No Cough, wheezing No nasal congestion, rhinorrhea, postnasal drip + Myalgias + Headache No Rash  No shortness of breath  No nausea, vomiting No diarrhea No abdominal pain     No Recent Strep, mono, flu, COVID exposure He got 1 dose of the COVID-vaccine.  Did not get this years flu vaccine. + Breathing difficulty,  sensation of throat swelling shut + Raspy voice No Drooling + Trismus No abx in past month.  No antipyretic in past 6 hrs  Past medical history: None. PCP: None.   History reviewed. No pertinent past medical history.  History reviewed. No pertinent surgical history.  History reviewed. No pertinent family history.  Social History[1]  Current Medications[2]  Allergies[3]   ROS  As noted in HPI.   Physical Exam  BP (!) 140/98 (BP Location: Left Arm)   Pulse 91   Temp (!) 100.8 F (38.2 C) (Oral)   Resp 17   Wt 81.6 kg   SpO2 98%   BMI 27.37 kg/m   Constitutional: Well developed, well nourished, no acute distress Eyes:  EOMI, conjunctiva normal bilaterally HENT: Normocephalic, atraumatic,mucus membranes moist.  No nasal congestion.  Intensely hyperemic oropharynx, worse on the right.  Extensive soft palate swelling on the right.  Uvular deviation to the left.  Positive trismus.  Muffled voice.  No drooling.  No neck stiffness. Respiratory: Normal inspiratory effort Cardiovascular: Normal rate, no murmurs, rubs, gallops GI: nondistended, nontender. No appreciable splenomegaly skin: No rash, skin intact Lymph: - positive anterior cervical LN worse on the right.  No posterior cervical lymphadenopathy. Musculoskeletal: no deformities Neurologic: Alert & oriented x 3, no focal neuro deficits Psychiatric:  Speech and behavior appropriate.  ED Course   Medications  diphenhydrAMINE  (BENADRYL ) 12.5 MG/5ML elixir 12.5 mg (has no administration in time range)  alum & mag hydroxide-simeth (MAALOX/MYLANTA) 200-200-20 MG/5ML suspension 5 mL (has no administration in time range)  ibuprofen  (ADVIL ) tablet 800 mg (800 mg Oral Given 04/10/24 0832)    Orders Placed This Encounter  Procedures   POC rapid strep A    Standing Status:   Standing    Number of Occurrences:   1    Results for orders placed or performed during the hospital encounter of 04/10/24 (from the past 24 hours)  POC rapid strep A     Status: Normal   Collection Time: 04/10/24  8:41 AM  Result Value Ref Range   Rapid Strep A Screen Negative Negative   No results found.  ED Clinical Impression  1. Peritonsillar abscess      ED Assessment/Plan    Patient presents with severe sore throat, he has swelling of the right soft palate and uvular deviation to the left and some mild trismus.  Significant concern for peritonsillar abscess.  Transferring to the emergency department for further diagnosis and treatment.  Giving 800 mg of ibuprofen  here, 5 mL of Benadryl /Maalox for pain.  He is stable to go via private vehicle.  Discussed rationale for transfer to emergency department with patient.  He agrees to go to [facility].  Gave report to [facility] charge nurse Delon  Rapid strep negative.  Meds ordered this encounter  Medications   ibuprofen  (ADVIL ) tablet 800 mg   diphenhydrAMINE  (BENADRYL )  12.5 MG/5ML elixir 12.5 mg   alum & mag hydroxide-simeth (MAALOX/MYLANTA) 200-200-20 MG/5ML suspension 5 mL    Mix with 5 mL of Benadryl    Addendum-original note read no concern for peritonsillar abscess.  This was a typo.  Primary concern was peritonsillar abscess.  This has been corrected.  *This clinic note was created using Dragon dictation software. Therefore, there may be occasional mistakes despite careful proofreading.       Van Knee, MD 04/10/24 [phone]     [1]  Social History Tobacco Use   Smoking status: Never   Smokeless tobacco: Never  Vaping Use   Vaping status: Every Day  Substance Use Topics   Alcohol use: Yes    Comment: social   Drug use: Not Currently  [2]  Current Facility-Administered Medications:    alum & mag hydroxide-simeth (MAALOX/MYLANTA) 200-200-20 MG/5ML suspension 5 mL, 5 mL, Oral, Once, Van Knee, MD   diphenhydrAMINE  (BENADRYL ) 12.5 MG/5ML elixir 12.5 mg, 12.5 mg, Oral, Once, Van Knee, MD  Current Outpatient Medications:    amitriptyline  (ELAVIL ) 25 MG tablet, Take 1 tablet (25 mg total) by mouth at bedtime., Disp: 30 tablet, Rfl: 0   EPINEPHrine 0.3 mg/0.3 mL IJ SOAJ injection, Inject into the muscle., Disp: , Rfl:  [3] No Known Allergies    Van Knee, MD 04/11/24 1416  "

## 2024-04-11 ENCOUNTER — Other Ambulatory Visit: Payer: Self-pay

## 2024-04-11 DIAGNOSIS — J039 Acute tonsillitis, unspecified: Secondary | ICD-10-CM | POA: Diagnosis not present

## 2024-04-11 LAB — CBC
HCT: 46.5 % (ref 39.0–52.0)
Hemoglobin: 16.8 g/dL (ref 13.0–17.0)
MCH: 31.2 pg (ref 26.0–34.0)
MCHC: 36.1 g/dL — ABNORMAL HIGH (ref 30.0–36.0)
MCV: 86.4 fL (ref 80.0–100.0)
Platelets: 315 K/uL (ref 150–400)
RBC: 5.38 MIL/uL (ref 4.22–5.81)
RDW: 10.9 % — ABNORMAL LOW (ref 11.5–15.5)
WBC: 12.1 K/uL — ABNORMAL HIGH (ref 4.0–10.5)
nRBC: 0 % (ref 0.0–0.2)

## 2024-04-11 LAB — COMPREHENSIVE METABOLIC PANEL WITH GFR
ALT: 16 U/L (ref 0–44)
AST: 18 U/L (ref 15–41)
Albumin: 4 g/dL (ref 3.5–5.0)
Alkaline Phosphatase: 79 U/L (ref 38–126)
Anion gap: 11 (ref 5–15)
BUN: 9 mg/dL (ref 6–20)
CO2: 26 mmol/L (ref 22–32)
Calcium: 9.5 mg/dL (ref 8.9–10.3)
Chloride: 100 mmol/L (ref 98–111)
Creatinine, Ser: 0.67 mg/dL (ref 0.61–1.24)
GFR, Estimated: >60 mL/min
Glucose, Bld: 152 mg/dL — ABNORMAL HIGH (ref 70–99)
Potassium: 4.4 mmol/L (ref 3.5–5.1)
Sodium: 138 mmol/L (ref 135–145)
Total Bilirubin: 0.5 mg/dL (ref 0.0–1.2)
Total Protein: 7.6 g/dL (ref 6.5–8.1)

## 2024-04-11 LAB — HIV ANTIBODY (ROUTINE TESTING W REFLEX): HIV Screen 4th Generation wRfx: NONREACTIVE

## 2024-04-11 LAB — PROTIME-INR
INR: 1.1 (ref 0.8–1.2)
Prothrombin Time: 14.9 s (ref 11.4–15.2)

## 2024-04-11 MED ORDER — AMOXICILLIN-POT CLAVULANATE 875-125 MG PO TABS
1.0000 | ORAL_TABLET | Freq: Two times a day (BID) | ORAL | 0 refills | Status: AC
  Filled 2024-04-11: qty 10, 5d supply, fill #0

## 2024-04-11 MED ORDER — DEXAMETHASONE 4 MG PO TABS
4.0000 mg | ORAL_TABLET | Freq: Two times a day (BID) | ORAL | 0 refills | Status: AC
  Filled 2024-04-11: qty 4, 2d supply, fill #0

## 2024-04-11 NOTE — Progress Notes (Signed)
 Discharge criteria met

## 2024-04-11 NOTE — Discharge Summary (Signed)
 " DISCHARGE SUMMARY    Manuel Mitchell FMW:[id number] DOB: 12-09-2000 DOA: 04/10/2024  PCP: Patient, No Pcp Per  Admit date: 04/10/2024 Discharge date: 04/11/2024   Recommendations for Outpatient Follow-up:  1.  Follow-up with ENT in 1 week as scheduled.   Hospital Course: Manuel Mitchell 24 year old male no past medical history presented with 5 days of sore throat and low-grade temperatures.  He was seen at urgent care and there was concern for possible peritonsillar abscess.  Strep throat reportedly negative. Upon arrival to the ED and mild leukocytosis of 11.9, CT with con showed bilateral tonsillitis more on the right side with enlarged lymph node.  EDP discussed with ENT Dr. Milissa who advised antibiotics and steroids. Patient was admitted and had significant improvement by 1/7 and endorsed feeling ready for DC home.  He was eating, breathing, drinking normally.  ED return precautions were given.  His parents were at bedside at the time of our evaluation.  Acute bilateral tonsillitis - Appreciated on CT with early separation of the right tonsil and secondary inflammation of the right Perry pharyngeal and submandibular spaces but no drainable abscess.  Reactive bilateral cervical lymphadenopathy also seen - ENT consulted - Started on Decadron  and Unasyn  - Significant resolution on repeat exam this morning.  Will discharge home with additional 5 days of Augmentin  and 2 more days of Decadron  - Flu/COVID/RSV negative.  Mono negative.  Rapid strep a screen negative. - Remote history of strep throat as a child but denies ever having issues with tonsillitis. - Outpatient follow-up with ENT.  Referral placed.   Discharge Instructions  Discharge Instructions     Ambulatory referral to ENT   Complete by: As directed    Hospital Follow up   Call MD for:  difficulty breathing, headache or visual disturbances   Complete by: As directed    Call MD for:  persistant dizziness or light-headedness    Complete by: As directed    Call MD for:  persistant nausea and vomiting   Complete by: As directed    Call MD for:  severe uncontrolled pain   Complete by: As directed    Call MD for:  temperature >100.4   Complete by: As directed    Diet general   Complete by: As directed    Discharge instructions   Complete by: As directed    Follow up with your primary care physician to discuss the medication changes during this admission   Increase activity slowly   Complete by: As directed       Allergies as of 04/11/2024   No Known Allergies      Medication List     STOP taking these medications    amitriptyline  25 MG tablet Commonly known as: ELAVIL        TAKE these medications    amoxicillin -clavulanate 875-125 MG tablet Commonly known as: AUGMENTIN  Take 1 tablet by mouth 2 (two) times daily for 5 days.   dexamethasone  4 MG tablet Commonly known as: DECADRON  Take 1 tablet (4 mg total) by mouth 2 (two) times daily for 2 days.   EPINEPHrine 0.3 mg/0.3 mL Soaj injection Commonly known as: EPI-PEN Inject into the muscle.        Allergies[1]  Consultations:    Procedures/Studies: CT Soft Tissue Neck W Contrast Result Date: 04/10/2024 EXAM: CT NECK WITH CONTRAST 04/10/2024 11:28:28 AM TECHNIQUE: CT of the neck was performed with the administration of 75 mL iohexol  (OMNIPAQUE ) 300 MG/ML solution. Multiplanar reformatted images are  provided for review. Automated exposure control, iterative reconstruction, and/or weight based adjustment of the mA/kV was utilized to reduce the radiation dose to as low as reasonably achievable. COMPARISON: None available. CLINICAL HISTORY: 24 year old male with persistent fever and sore throat. Peritonsillar abscess. FINDINGS: AERODIGESTIVE TRACT: Larynx and epiglottis are within normal limits. The hypopharynx is mildly distended with gas. Lingual tonsil within normal limits. Bilateral palatine tonsils are heterogeneously enlarged, effacing the  midline (series 2 image 41 and coronal image 52). There are indistinct areas of hypodensity within the palatine tonsils more apparent on the right (series 2 image 34 and coronal image 55), but no organized or drainable tonsillar abscess. There is associated inflammation in the right parapharyngeal space (series 2 image 29). Left parapharyngeal space remains normal. There is mild if any retropharyngeal space edema. Adenoid soft tissues also appear more normal. Negative sublingual space. SALIVARY GLANDS: The parotid glands are unremarkable. The right submandibular gland appears to remain normal. There is secondary inflammation in the right submandibular space (series 2 image 52). Other salivary glands appear normal. THYROID: Unremarkable. LYMPH NODES: Solid enlarged, reactive appearing bilateral cervical lymphadenopathy. The largest nodes are at the bilateral levels 2 stations measuring up to 15 mm short axis. SOFT TISSUES: No mass or fluid collection. BONES: No acute dental finding. No acute osseous abnormality. OTHER: Visualized sinuses and mastoid air cells are well aerated. Minimal paranasal sinus mucosal thickening. No sinus fluid levels. Tympanic cavities and mastoids are clear. Negative visible brain parenchyma and orbits. Major vascular structures in the bilateral neck and at the skull base are enhancing and appear normal. Negative visible upper chest. Visualized lungs are clear. IMPRESSION: 1. Acute bilateral tonsillitis. Early suppuration of the right tonsil, and secondary inflammation in the right parapharyngeal and submandibular spaces. But no organized or drainable abscess. 2. Reactive bilateral cervical lymphadenopathy. Electronically signed by: Helayne Hurst MD 04/10/2024 12:02 PM EST RP Workstation: [id number]      Discharge Exam: Vitals:   04/11/24 0405 04/11/24 0744  BP: 125/81 130/87  Pulse: 78 71  Resp: 18 18  Temp: 97.7 F (36.5 C) (!) 97.5 F (36.4 C)  SpO2: 97% 99%   Vitals:    04/10/24 0917 04/10/24 2004 04/11/24 0405 04/11/24 0744  BP:  (!) 137/92 125/81 130/87  Pulse:  83 78 71  Resp:  20 18 18   Temp:  98.7 F (37.1 C) 97.7 F (36.5 C) (!) 97.5 F (36.4 C)  TempSrc:    Oral  SpO2:  99% 97% 99%  Weight: 81.6 kg     Height: 5' 10 (1.778 m)       Constitutional:  Normal appearance. Non toxic-appearing.  HENT: Head Normocephalic and atraumatic.  Bilateral tonsillar enlargement, erythema in pharynx.  No muffled voice. Eyes:  Extraocular intact. Conjunctivae normal.  Cardiovascular: Rate and Rhythm: Normal rate and regular rhythm.  Pulmonary: Non labored, symmetric rise of chest wall.  Skin: warm and dry. not jaundiced.  Neurological: No focal deficit present. alert. Oriented.  Psychiatric: Mood and Affect congruent.    The results of significant diagnostics from this hospitalization (including imaging, microbiology, ancillary and laboratory) are listed below for reference.     Microbiology: Recent Results (from the past 240 hours)  Resp panel by RT-PCR (RSV, Flu A&B, Covid) Anterior Nasal Swab     Status: None   Collection Time: 04/10/24  1:10 PM   Specimen: Anterior Nasal Swab  Result Value Ref Range Status   SARS Coronavirus 2 by RT PCR NEGATIVE NEGATIVE Final  Comment: (NOTE) SARS-CoV-2 target nucleic acids are NOT DETECTED.  The SARS-CoV-2 RNA is generally detectable in upper respiratory specimens during the acute phase of infection. The lowest concentration of SARS-CoV-2 viral copies this assay can detect is 138 copies/mL. A negative result does not preclude SARS-Cov-2 infection and should not be used as the sole basis for treatment or other patient management decisions. A negative result may occur with  improper specimen collection/handling, submission of specimen other than nasopharyngeal swab, presence of viral mutation(s) within the areas targeted by this assay, and inadequate number of viral copies(<138 copies/mL). A negative result  must be combined with clinical observations, patient history, and epidemiological information. The expected result is Negative.  Fact Sheet for Patients:  bloggercourse.com  Fact Sheet for Healthcare Providers:  seriousbroker.it  This test is no t yet approved or cleared by the [place]  FDA and  has been authorized for detection and/or diagnosis of SARS-CoV-2 by FDA under an Emergency Use Authorization (EUA). This EUA will remain  in effect (meaning this test can be used) for the duration of the COVID-19 declaration under Section 564(b)(1) of the Act, 21 U.S.C.section 360bbb-3(b)(1), unless the authorization is terminated  or revoked sooner.       Influenza A by PCR NEGATIVE NEGATIVE Final   Influenza B by PCR NEGATIVE NEGATIVE Final    Comment: (NOTE) The Xpert Xpress SARS-CoV-2/FLU/RSV plus assay is intended as an aid in the diagnosis of influenza from Nasopharyngeal swab specimens and should not be used as a sole basis for treatment. Nasal washings and aspirates are unacceptable for Xpert Xpress SARS-CoV-2/FLU/RSV testing.  Fact Sheet for Patients: bloggercourse.com  Fact Sheet for Healthcare Providers: seriousbroker.it  This test is not yet approved or cleared by the [place]  FDA and has been authorized for detection and/or diagnosis of SARS-CoV-2 by FDA under an Emergency Use Authorization (EUA). This EUA will remain in effect (meaning this test can be used) for the duration of the COVID-19 declaration under Section 564(b)(1) of the Act, 21 U.S.C. section 360bbb-3(b)(1), unless the authorization is terminated or revoked.     Resp Syncytial Virus by PCR NEGATIVE NEGATIVE Final    Comment: (NOTE) Fact Sheet for Patients: bloggercourse.com  Fact Sheet for Healthcare Providers: seriousbroker.it  This test is  not yet approved or cleared by the [place]  FDA and has been authorized for detection and/or diagnosis of SARS-CoV-2 by FDA under an Emergency Use Authorization (EUA). This EUA will remain in effect (meaning this test can be used) for the duration of the COVID-19 declaration under Section 564(b)(1) of the Act, 21 U.S.C. section 360bbb-3(b)(1), unless the authorization is terminated or revoked.  Performed at [facility], [address] [place], [place] [address]      Labs: BNP (last 3 results) No results for input(s): BNP in the last 8760 hours. Basic Metabolic Panel: Recent Labs  Lab 04/10/24 1048 04/11/24 0535  NA 133* 138  K 4.8 4.4  CL 97* 100  CO2 22 26  GLUCOSE 101* 152*  BUN 7 9  CREATININE 0.83 0.67  CALCIUM 9.5 9.5   Liver Function Tests: Recent Labs  Lab 04/10/24 1048 04/11/24 0535  AST 37 18  ALT 16 16  ALKPHOS 80 79  BILITOT 1.0 0.5  PROT 7.9 7.6  ALBUMIN 4.0 4.0   No results for input(s): LIPASE, AMYLASE in the last 168 hours. No results for input(s): AMMONIA in the last 168 hours. CBC: Recent Labs  Lab 04/10/24 1048 04/11/24 0535  WBC 11.9* 12.1*  NEUTROABS 8.6*  --   HGB 16.3 16.8  HCT 46.3 46.5  MCV 87.5 86.4  PLT 250 315   Cardiac Enzymes: No results for input(s): CKTOTAL, CKMB, CKMBINDEX, TROPONINI in the last 168 hours. BNP: Invalid input(s): POCBNP CBG: No results for input(s): GLUCAP in the last 168 hours. D-Dimer No results for input(s): DDIMER in the last 72 hours. Hgb A1c No results for input(s): HGBA1C in the last 72 hours. Lipid Profile No results for input(s): CHOL, HDL, LDLCALC, TRIG, CHOLHDL, LDLDIRECT in the last 72 hours. Thyroid function studies No results for input(s): TSH, T4TOTAL, T3FREE, THYROIDAB in the last 72 hours.  Invalid input(s): FREET3 Anemia work up No results for input(s): VITAMINB12, FOLATE, FERRITIN, TIBC, IRON, RETICCTPCT  in the last 72 hours. Urinalysis    Component Value Date/Time   COLORURINE YELLOW 11/24/2017 2009   APPEARANCEUR CLEAR 11/24/2017 2009   LABSPEC 1.020 11/24/2017 2009   PHURINE 5.5 11/24/2017 2009   GLUCOSEU NEGATIVE 11/24/2017 2009   HGBUR NEGATIVE 11/24/2017 2009   BILIRUBINUR NEGATIVE 11/24/2017 2009   KETONESUR NEGATIVE 11/24/2017 2009   PROTEINUR NEGATIVE 11/24/2017 2009   NITRITE NEGATIVE 11/24/2017 2009   LEUKOCYTESUR NEGATIVE 11/24/2017 2009   Sepsis Labs Recent Labs  Lab 04/10/24 1048 04/11/24 0535  WBC 11.9* 12.1*   Microbiology Recent Results (from the past 240 hours)  Resp panel by RT-PCR (RSV, Flu A&B, Covid) Anterior Nasal Swab     Status: None   Collection Time: 04/10/24  1:10 PM   Specimen: Anterior Nasal Swab  Result Value Ref Range Status   SARS Coronavirus 2 by RT PCR NEGATIVE NEGATIVE Final    Comment: (NOTE) SARS-CoV-2 target nucleic acids are NOT DETECTED.  The SARS-CoV-2 RNA is generally detectable in upper respiratory specimens during the acute phase of infection. The lowest concentration of SARS-CoV-2 viral copies this assay can detect is 138 copies/mL. A negative result does not preclude SARS-Cov-2 infection and should not be used as the sole basis for treatment or other patient management decisions. A negative result may occur with  improper specimen collection/handling, submission of specimen other than nasopharyngeal swab, presence of viral mutation(s) within the areas targeted by this assay, and inadequate number of viral copies(<138 copies/mL). A negative result must be combined with clinical observations, patient history, and epidemiological information. The expected result is Negative.  Fact Sheet for Patients:  bloggercourse.com  Fact Sheet for Healthcare Providers:  seriousbroker.it  This test is no t yet approved or cleared by the [place]  FDA and  has been authorized for  detection and/or diagnosis of SARS-CoV-2 by FDA under an Emergency Use Authorization (EUA). This EUA will remain  in effect (meaning this test can be used) for the duration of the COVID-19 declaration under Section 564(b)(1) of the Act, 21 U.S.C.section 360bbb-3(b)(1), unless the authorization is terminated  or revoked sooner.       Influenza A by PCR NEGATIVE NEGATIVE Final   Influenza B by PCR NEGATIVE NEGATIVE Final    Comment: (NOTE) The Xpert Xpress SARS-CoV-2/FLU/RSV plus assay is intended as an aid in the diagnosis of influenza from Nasopharyngeal swab specimens and should not be used as a sole basis for treatment. Nasal washings and aspirates are unacceptable for Xpert Xpress SARS-CoV-2/FLU/RSV testing.  Fact Sheet for Patients: bloggercourse.com  Fact Sheet for Healthcare Providers: seriousbroker.it  This test is not yet approved or cleared by the [place]  FDA and has been authorized for detection and/or diagnosis of SARS-CoV-2 by  FDA under an Emergency Use Authorization (EUA). This EUA will remain in effect (meaning this test can be used) for the duration of the COVID-19 declaration under Section 564(b)(1) of the Act, 21 U.S.C. section 360bbb-3(b)(1), unless the authorization is terminated or revoked.     Resp Syncytial Virus by PCR NEGATIVE NEGATIVE Final    Comment: (NOTE) Fact Sheet for Patients: bloggercourse.com  Fact Sheet for Healthcare Providers: seriousbroker.it  This test is not yet approved or cleared by the [place]  FDA and has been authorized for detection and/or diagnosis of SARS-CoV-2 by FDA under an Emergency Use Authorization (EUA). This EUA will remain in effect (meaning this test can be used) for the duration of the COVID-19 declaration under Section 564(b)(1) of the Act, 21 U.S.C. section 360bbb-3(b)(1), unless the authorization is  terminated or revoked.  Performed at [facility], [address] [place], [place] [address]      Time coordinating discharge: 32 min   SIGNED: Lorane Poland, DO [facility] 04/11/2024, 12:40 PM Pager   If 7PM-7AM, please contact night-coverage     [1] No Known Allergies  "
